# Patient Record
Sex: Male | Born: 1990 | Race: White | Hispanic: No | Marital: Single | Smoking: Never smoker
Health system: Southern US, Community
[De-identification: ages and names within clinical notes are randomized; demographics above are authoritative.]

## PROBLEM LIST (undated history)

## (undated) HISTORY — PX: CHOLECYSTECTOMY: SHX55

---
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
## 2012-11-03 ENCOUNTER — Encounter (HOSPITAL_COMMUNITY): Payer: Self-pay | Admitting: Emergency Medicine

## 2012-11-03 ENCOUNTER — Emergency Department (HOSPITAL_COMMUNITY)
Admission: EM | Admit: 2012-11-03 | Discharge: 2012-11-04 | Disposition: A | Discharge status: Home / Self Care | Payer: No Typology Code available for payment source | Attending: Emergency Medicine | Admitting: Emergency Medicine

## 2012-11-03 DIAGNOSIS — IMO0002 Reserved for concepts with insufficient information to code with codable children: Secondary | ICD-10-CM | POA: Insufficient documentation

## 2012-11-03 DIAGNOSIS — M542 Cervicalgia: Secondary | ICD-10-CM

## 2012-11-03 DIAGNOSIS — Y92410 Unspecified street and highway as the place of occurrence of the external cause: Secondary | ICD-10-CM | POA: Insufficient documentation

## 2012-11-03 DIAGNOSIS — S199XXA Unspecified injury of neck, initial encounter: Secondary | ICD-10-CM | POA: Insufficient documentation

## 2012-11-03 DIAGNOSIS — M62838 Other muscle spasm: Secondary | ICD-10-CM | POA: Insufficient documentation

## 2012-11-03 DIAGNOSIS — S0993XA Unspecified injury of face, initial encounter: Secondary | ICD-10-CM | POA: Insufficient documentation

## 2012-11-03 DIAGNOSIS — Y9389 Activity, other specified: Secondary | ICD-10-CM | POA: Insufficient documentation

## 2012-11-03 DIAGNOSIS — M546 Pain in thoracic spine: Secondary | ICD-10-CM

## 2012-11-03 MED ORDER — IBUPROFEN 800 MG PO TABS
800.0000 mg | ORAL_TABLET | Freq: Once | ORAL | Status: AC
  Administered 2012-11-04: 800 mg via ORAL
  Filled 2012-11-03: qty 1

## 2012-11-03 MED ORDER — DIAZEPAM 5 MG PO TABS
5.0000 mg | ORAL_TABLET | Freq: Once | ORAL | Status: AC
  Administered 2012-11-04: 5 mg via ORAL
  Filled 2012-11-03: qty 1

## 2012-11-03 MED ORDER — HYDROCODONE-ACETAMINOPHEN 5-325 MG PO TABS
2.0000 | ORAL_TABLET | Freq: Once | ORAL | Status: AC
  Administered 2012-11-04: 2 via ORAL
  Filled 2012-11-03: qty 2
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
## 2012-11-04 MED ORDER — DIAZEPAM 5 MG PO TABS: 5.0000 mg | ORAL_TABLET | Freq: Three times a day (TID) | ORAL | Status: DC | PRN

## 2012-11-04 MED ORDER — HYDROCODONE-ACETAMINOPHEN 5-325 MG PO TABS: 2.0000 | ORAL_TABLET | ORAL | Status: DC | PRN

## 2012-11-04 MED ORDER — IBUPROFEN 800 MG PO TABS: 800.0000 mg | ORAL_TABLET | Freq: Three times a day (TID) | ORAL | Status: DC
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
## 2014-06-27 ENCOUNTER — Emergency Department (HOSPITAL_COMMUNITY): Payer: Self-pay

## 2014-06-27 ENCOUNTER — Emergency Department (HOSPITAL_COMMUNITY)
Admission: EM | Admit: 2014-06-27 | Discharge: 2014-06-27 | Disposition: A | Discharge status: Home / Self Care | Payer: Self-pay | Attending: Emergency Medicine | Admitting: Emergency Medicine

## 2014-06-27 ENCOUNTER — Encounter (HOSPITAL_COMMUNITY): Payer: Self-pay | Admitting: Emergency Medicine

## 2014-06-27 DIAGNOSIS — Z79899 Other long term (current) drug therapy: Secondary | ICD-10-CM | POA: Insufficient documentation

## 2014-06-27 DIAGNOSIS — S99911A Unspecified injury of right ankle, initial encounter: Secondary | ICD-10-CM

## 2014-06-27 DIAGNOSIS — IMO0002 Reserved for concepts with insufficient information to code with codable children: Secondary | ICD-10-CM | POA: Insufficient documentation

## 2014-06-27 DIAGNOSIS — S99929A Unspecified injury of unspecified foot, initial encounter: Principal | ICD-10-CM

## 2014-06-27 DIAGNOSIS — Z791 Long term (current) use of non-steroidal anti-inflammatories (NSAID): Secondary | ICD-10-CM | POA: Insufficient documentation

## 2014-06-27 DIAGNOSIS — S99919A Unspecified injury of unspecified ankle, initial encounter: Principal | ICD-10-CM

## 2014-06-27 DIAGNOSIS — Y939 Activity, unspecified: Secondary | ICD-10-CM | POA: Insufficient documentation

## 2014-06-27 DIAGNOSIS — S8990XA Unspecified injury of unspecified lower leg, initial encounter: Secondary | ICD-10-CM | POA: Insufficient documentation

## 2014-06-27 DIAGNOSIS — Y9289 Other specified places as the place of occurrence of the external cause: Secondary | ICD-10-CM | POA: Insufficient documentation

## 2014-06-27 MED ORDER — TRAMADOL HCL 50 MG PO TABS: 50.0000 mg | ORAL_TABLET | Freq: Four times a day (QID) | ORAL | Status: AC | PRN

## 2014-06-27 MED ORDER — TRAMADOL HCL 50 MG PO TABS
50.0000 mg | ORAL_TABLET | Freq: Once | ORAL | Status: AC
  Administered 2014-06-27: 50 mg via ORAL
  Filled 2014-06-27: qty 1

## 2014-06-27 NOTE — ED Provider Notes (Signed)
CSN: [id number]     Arrival date & time 06/27/14  1338 History  This chart was scribed Emilia BeckKaitlyn Elian Gloster, PA-C, working with Merrie RoofJohn David Wofford III,  MD, by Leona CarryG. Clay Sherrill, ED Scribe. The patient was seen in TR11C/TR11C. The patient's care was started at 2:41 PM.     Chief Complaint  Patient presents with  . Ankle Pain   Patient is a 23 y.o. male presenting with ankle pain. The history is provided by the patient. No language interpreter was used.  Ankle Pain  HPI Comments: Corey SilkJoshua A Parveen is a 23 y.o. male who presents to the Emergency Department complaining of constant right ankle pain with associated swelling beginning yesterday when his grandfather fell and landed on his leg. Patient states that the pain radiates to his foot and shin. He reports that the pain is exacerbated by ROM. Patient denies numbness.   Patient does not have a PCP.   History reviewed. No pertinent past medical history. Past Surgical History  Procedure Laterality Date  . Cholecystectomy     No family history on file. History  Substance Use Topics  . Smoking status: Never Smoker   . Smokeless tobacco: Not on file  . Alcohol Use: No    Review of Systems  Musculoskeletal: Positive for arthralgias (right ankle).  Neurological: Negative for numbness.  All other systems reviewed and are negative.     Allergies  Review of patient's allergies indicates no known allergies.  Home Medications   Prior to Admission medications   Medication Sig Start Date End Date Taking? Authorizing Provider  diazepam (VALIUM) 5 MG tablet Take 1 tablet (5 mg total) by mouth every 8 (eight) hours as needed for anxiety (muscle spasm). 11/04/12   Olivia Mackielga M Otter, MD  HYDROcodone-acetaminophen (NORCO/VICODIN) 5-325 MG per tablet Take 2 tablets by mouth every 4 (four) hours as needed for pain. 11/04/12   Olivia Mackielga M Otter, MD  ibuprofen (ADVIL,MOTRIN) 800 MG tablet Take 1 tablet (800 mg total) by mouth 3 (three) times daily. 11/04/12    Olivia Mackielga M Otter, MD   Triage Vitals: BP 129/90  Pulse 79  Temp(Src) 97.6 F (36.4 C) (Oral)  Resp 18  Ht 6' (1.829 m)  Wt 235 lb (106.595 kg)  BMI 31.86 kg/m2  SpO2 96% Physical Exam  Nursing note and vitals reviewed. Constitutional: He is oriented to person, place, and time. He appears well-developed and well-nourished. No distress.  HENT:  Head: Normocephalic and atraumatic.  Eyes: Conjunctivae are normal.  Neck: Normal range of motion.  Cardiovascular: Normal rate and regular rhythm.  Exam reveals no gallop and no friction rub.   No murmur heard. Pulmonary/Chest: Effort normal and breath sounds normal. He has no wheezes. He has no rales. He exhibits no tenderness.  Musculoskeletal: Normal range of motion.  Neurological: He is alert and oriented to person, place, and time. Coordination normal.  Speech is goal-oriented. Moves limbs without ataxia.   Skin: Skin is warm and dry.  Abrasion noted to right distal tibia. Generalized right ankle tenderness to palpation. No obvious deformity. Patient is abe to wiggle toes.   Psychiatric: He has a normal mood and affect. His behavior is normal.    ED Course  Procedures (including critical care time) DIAGNOSTIC STUDIES: Oxygen Saturation is 96% on room air, normal by my interpretation.    COORDINATION OF CARE: 2:44 PM-Discussed treatment plan which includes a right ankle x-ray with pt at bedside and pt agreed to plan.    SPLINT APPLICATION  Date/Time: 3:03 PM Authorized by: Emilia Beck Consent: Verbal consent obtained. Risks and benefits: risks, benefits and alternatives were discussed Consent given by: patient Splint applied by: orthopedic technician Location details: right ankle Splint type: ASO brace Supplies used: ASO brace Post-procedure: The splinted body part was neurovascularly unchanged following the procedure. Patient tolerance: Patient tolerated the procedure well with no immediate complications.     Labs  Review Labs Reviewed - No data to display  Imaging Review Dg Ankle Complete Right  06/27/2014   CLINICAL DATA:  Ankle pain with numbness  EXAM: RIGHT ANKLE - COMPLETE 3+ VIEW  COMPARISON:  None.  FINDINGS: There is no evidence of fracture, dislocation, or joint effusion. There is no evidence of arthropathy or other focal bone abnormality. Soft tissues are unremarkable.  IMPRESSION: No acute osseous abnormality.   Electronically Signed   By: Genevive Bi M.D.   On: 06/27/2014 14:32     EKG Interpretation None      MDM   Final diagnoses:  Right ankle injury, initial encounter    3:03 PM Patient's xray unremarkable for acute changes. No neurovascular compromise. Patient will have Tramadol for pain. No other injury.   I personally performed the services described in this documentation, which was scribed in my presence. The recorded information has been reviewed and is accurate.    Emilia Beck, PA-C 06/27/14 1504

## 2014-06-27 NOTE — ED Notes (Signed)
Patient states he woke up out of sleep last night with pain and swelling in R ankle.  Patient denies injury.

## 2014-06-27 NOTE — ED Provider Notes (Signed)
Medical screening examination/treatment/procedure(s) were performed by non-physician practitioner and as supervising physician I was immediately available for consultation/collaboration.   Sammye Staff David Aprill Banko III, MD 06/27/14 2206 

## 2014-06-27 NOTE — Discharge Instructions (Signed)
Take Tramadol as needed for pain. Use ankle brace for support. Bear weight as tolerated. Rest, ice, and elevate your ankle for pain relief.

## 2015-07-23 IMAGING — CR DG ANKLE COMPLETE 3+V*R*
3 series · 3 of 3 positions shown · non-contrast
Comparison: None.

CLINICAL DATA: Ankle pain with numbness

EXAM:
RIGHT ANKLE - COMPLETE 3+ VIEW

[t ankle joint ap right]
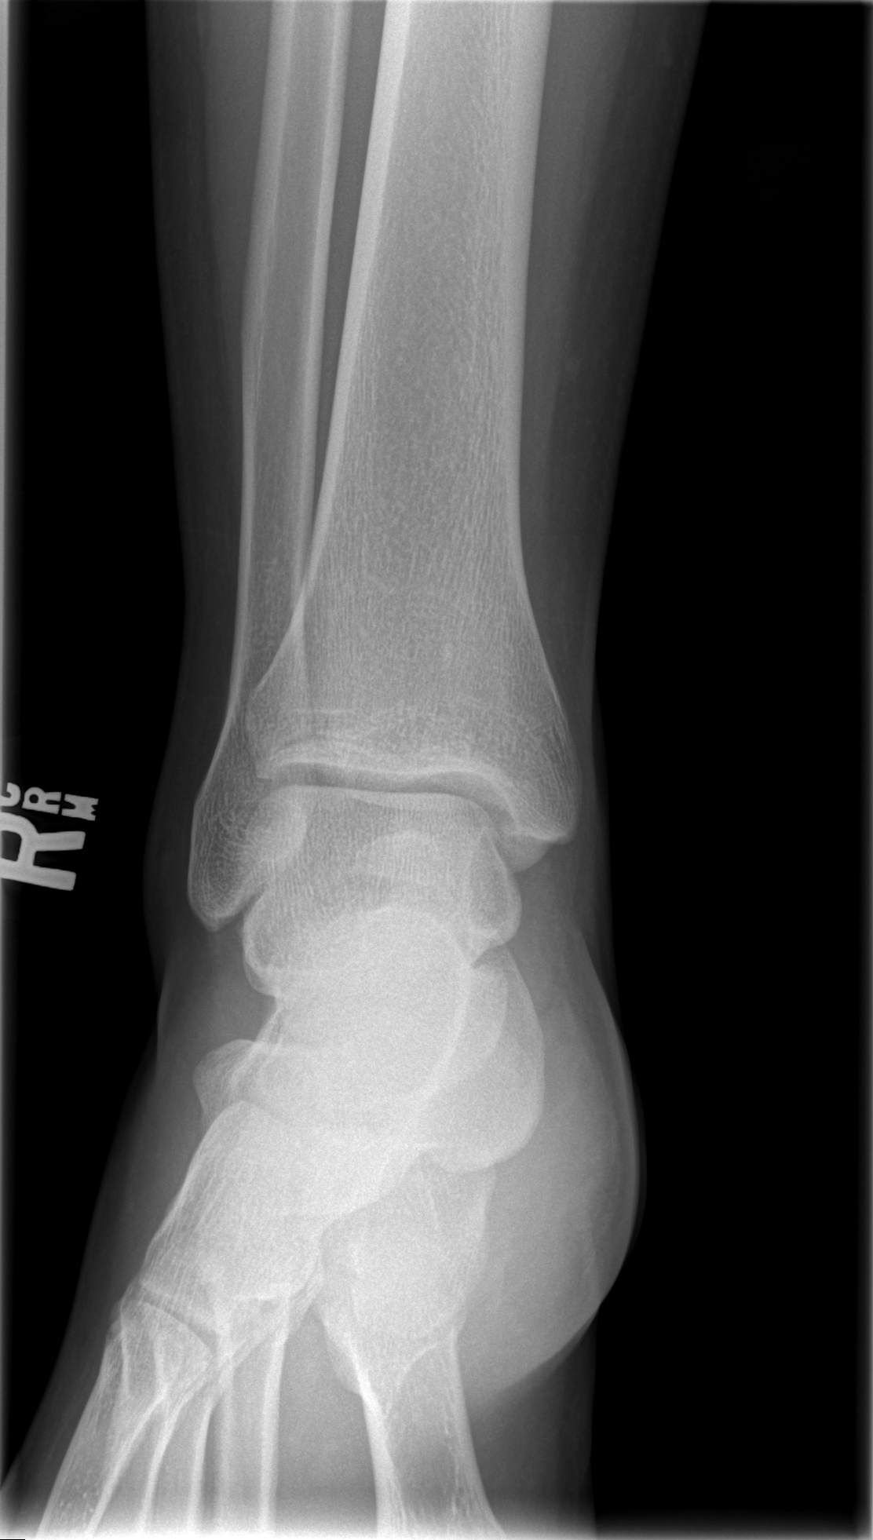

[t ankle joint oblique right]
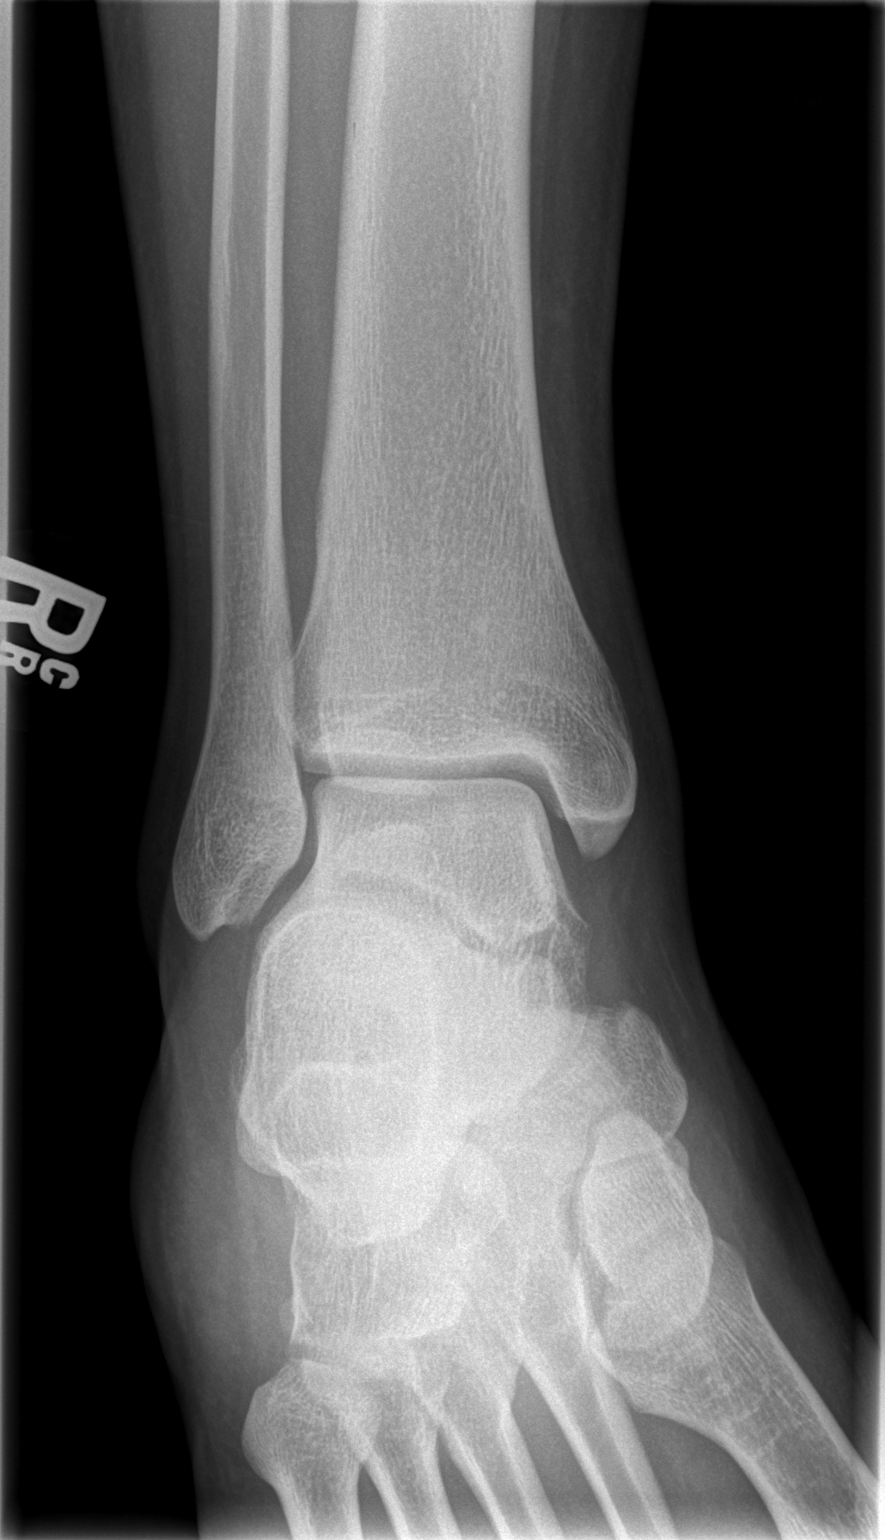

[t ankle joint lat right]
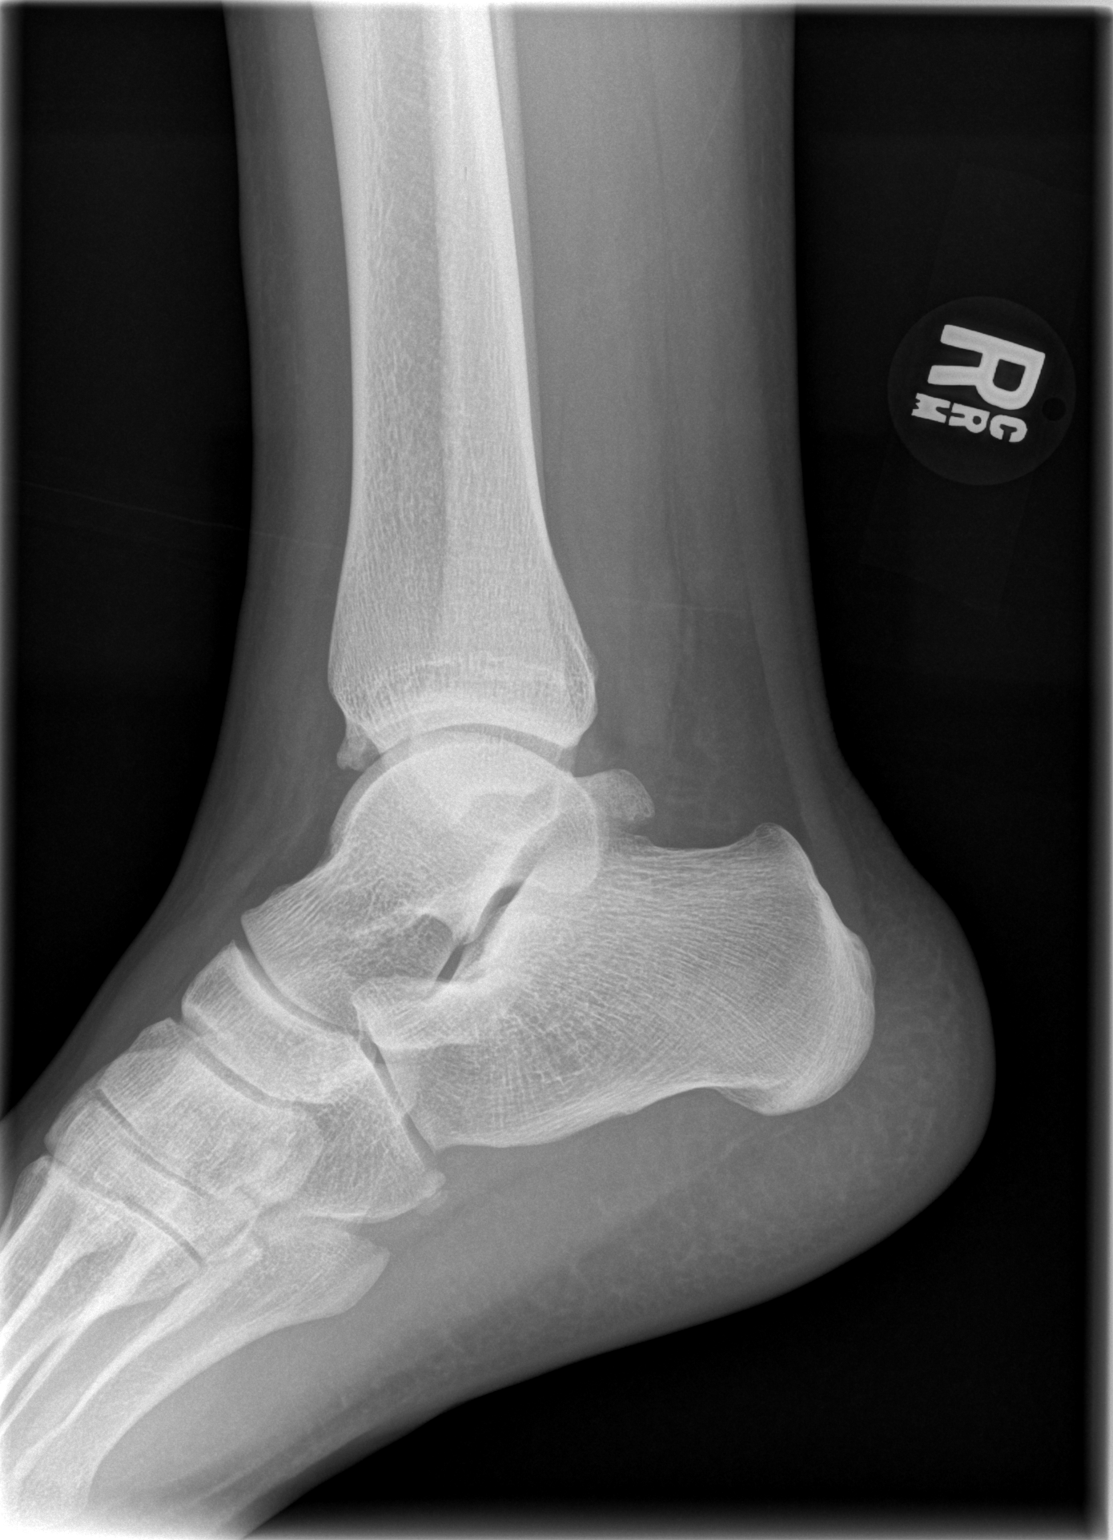

[3 of 3 positions shown; findings below may reference images not displayed]

FINDINGS: There is no evidence of fracture, dislocation, or joint effusion.
There is no evidence of arthropathy or other focal bone abnormality.
Soft tissues are unremarkable.
IMPRESSION: No acute osseous abnormality.

## 2015-09-19 ENCOUNTER — Encounter (HOSPITAL_COMMUNITY): Payer: Self-pay | Admitting: General Practice

## 2015-09-19 ENCOUNTER — Emergency Department (HOSPITAL_COMMUNITY)
Admission: EM | Admit: 2015-09-19 | Discharge: 2015-09-19 | Disposition: A | Discharge status: Home / Self Care | Payer: Worker's Compensation | Attending: Emergency Medicine | Admitting: Emergency Medicine

## 2015-09-19 DIAGNOSIS — Y9289 Other specified places as the place of occurrence of the external cause: Secondary | ICD-10-CM | POA: Insufficient documentation

## 2015-09-19 DIAGNOSIS — S0591XA Unspecified injury of right eye and orbit, initial encounter: Secondary | ICD-10-CM | POA: Insufficient documentation

## 2015-09-19 DIAGNOSIS — S0990XA Unspecified injury of head, initial encounter: Secondary | ICD-10-CM | POA: Diagnosis not present

## 2015-09-19 DIAGNOSIS — Y9389 Activity, other specified: Secondary | ICD-10-CM | POA: Insufficient documentation

## 2015-09-19 DIAGNOSIS — Y990 Civilian activity done for income or pay: Secondary | ICD-10-CM | POA: Diagnosis not present

## 2015-09-19 DIAGNOSIS — W2209XA Striking against other stationary object, initial encounter: Secondary | ICD-10-CM | POA: Insufficient documentation

## 2015-09-19 MED ORDER — ACETAMINOPHEN 500 MG PO TABS: 500.0000 mg | ORAL_TABLET | Freq: Four times a day (QID) | ORAL | Status: AC | PRN

## 2015-09-19 MED ORDER — ACETAMINOPHEN 325 MG PO TABS
650.0000 mg | ORAL_TABLET | Freq: Once | ORAL | Status: AC
  Administered 2015-09-19: 650 mg via ORAL
  Filled 2015-09-19: qty 2

## 2015-09-19 NOTE — Discharge Instructions (Signed)
1. Medications: tylenol, usual home medications 2. Treatment: rest, drink plenty of fluids 3. Follow Up: please followup with your primary doctor in 2-3 for discussion of your diagnoses and further evaluation after today's visit; if you do not have a primary care doctor use the resource guide provided to find one; please return to the ER for severe headache, vision changes, loss of consciousness, lethargy, new or worsening symptoms   Concussion, Adult A concussion, or closed-head injury, is a brain injury caused by a direct blow to the head or by a quick and sudden movement (jolt) of the head or neck. Concussions are usually not life-threatening. Even so, the effects of a concussion can be serious. If you have had a concussion before, you are more likely to experience concussion-like symptoms after a direct blow to the head.  CAUSES  Direct blow to the head, such as from running into another player during a soccer game, being hit in a fight, or hitting your head on a hard surface.  A jolt of the head or neck that causes the brain to move back and forth inside the skull, such as in a car crash. SIGNS AND SYMPTOMS The signs of a concussion can be hard to notice. Early on, they may be missed by you, family members, and health care providers. You may look fine but act or feel differently. Symptoms are usually temporary, but they may last for days, weeks, or even longer. Some symptoms may appear right away while others may not show up for hours or days. Every head injury is different. Symptoms include:  Mild to moderate headaches that will not go away.  A feeling of pressure inside your head.  Having more trouble than usual:  Learning or remembering things you have heard.  Answering questions.  Paying attention or concentrating.  Organizing daily tasks.  Making decisions and solving problems.  Slowness in thinking, acting or reacting, speaking, or reading.  Getting lost or being easily  confused.  Feeling tired all the time or lacking energy (fatigued).  Feeling drowsy.  Sleep disturbances.  Sleeping more than usual.  Sleeping less than usual.  Trouble falling asleep.  Trouble sleeping (insomnia).  Loss of balance or feeling lightheaded or dizzy.  Nausea or vomiting.  Numbness or tingling.  Increased sensitivity to:  Sounds.  Lights.  Distractions.  Vision problems or eyes that tire easily.  Diminished sense of taste or smell.  Ringing in the ears.  Mood changes such as feeling sad or anxious.  Becoming easily irritated or angry for little or no reason.  Lack of motivation.  Seeing or hearing things other people do not see or hear (hallucinations). DIAGNOSIS Your health care provider can usually diagnose a concussion based on a description of your injury and symptoms. He or she will ask whether you passed out (lost consciousness) and whether you are having trouble remembering events that happened right before and during your injury. Your evaluation might include:  A brain scan to look for signs of injury to the brain. Even if the test shows no injury, you may still have a concussion.  Blood tests to be sure other problems are not present. TREATMENT  Concussions are usually treated in an emergency department, in urgent care, or at a clinic. You may need to stay in the hospital overnight for further treatment.  Tell your health care provider if you are taking any medicines, including prescription medicines, over-the-counter medicines, and natural remedies. Some medicines, such as blood thinners (  anticoagulants) and aspirin, may increase the chance of complications. Also tell your health care provider whether you have had alcohol or are taking illegal drugs. This information may affect treatment.  Your health care provider will send you home with important instructions to follow.  How fast you will recover from a concussion depends on many  factors. These factors include how severe your concussion is, what part of your brain was injured, your age, and how healthy you were before the concussion.  Most people with mild injuries recover fully. Recovery can take time. In general, recovery is slower in older persons. Also, persons who have had a concussion in the past or have other medical problems may find that it takes longer to recover from their current injury. HOME CARE INSTRUCTIONS General Instructions  Carefully follow the directions your health care provider gave you.  Only take over-the-counter or prescription medicines for pain, discomfort, or fever as directed by your health care provider.  Take only those medicines that your health care provider has approved.  Do not drink alcohol until your health care provider says you are well enough to do so. Alcohol and certain other drugs may slow your recovery and can put you at risk of further injury.  If it is harder than usual to remember things, write them down.  If you are easily distracted, try to do one thing at a time. For example, do not try to watch TV while fixing dinner.  Talk with family members or close friends when making important decisions.  Keep all follow-up appointments. Repeated evaluation of your symptoms is recommended for your recovery.  Watch your symptoms and tell others to do the same. Complications sometimes occur after a concussion. Older adults with a brain injury may have a higher risk of serious complications, such as a blood clot on the brain.  Tell your teachers, school nurse, school counselor, coach, athletic trainer, or work Production designer, theatre/television/film about your injury, symptoms, and restrictions. Tell them about what you can or cannot do. They should watch for:  Increased problems with attention or concentration.  Increased difficulty remembering or learning new information.  Increased time needed to complete tasks or assignments.  Increased irritability  or decreased ability to cope with stress.  Increased symptoms.  Rest. Rest helps the brain to heal. Make sure you:  Get plenty of sleep at night. Avoid staying up late at night.  Keep the same bedtime hours on weekends and weekdays.  Rest during the day. Take daytime naps or rest breaks when you feel tired.  Limit activities that require a lot of thought or concentration. These include:  Doing homework or job-related work.  Watching TV.  Working on the computer.  Avoid any situation where there is potential for another head injury (football, hockey, soccer, basketball, martial arts, downhill snow sports and horseback riding). Your condition will get worse every time you experience a concussion. You should avoid these activities until you are evaluated by the appropriate follow-up health care providers. Returning To Your Regular Activities You will need to return to your normal activities slowly, not all at once. You must give your body and brain enough time for recovery.  Do not return to sports or other athletic activities until your health care provider tells you it is safe to do so.  Ask your health care provider when you can drive, ride a bicycle, or operate heavy machinery. Your ability to react may be slower after a brain injury. Never do these activities  if you are dizzy.  Ask your health care provider about when you can return to work or school. Preventing Another Concussion It is very important to avoid another brain injury, especially before you have recovered. In rare cases, another injury can lead to permanent brain damage, brain swelling, or death. The risk of this is greatest during the first 7-10 days after a head injury. Avoid injuries by:  Wearing a seat belt when riding in a car.  Drinking alcohol only in moderation.  Wearing a helmet when biking, skiing, skateboarding, skating, or doing similar activities.  Avoiding activities that could lead to a second  concussion, such as contact or recreational sports, until your health care provider says it is okay.  Taking safety measures in your home.  Remove clutter and tripping hazards from floors and stairways.  Use grab bars in bathrooms and handrails by stairs.  Place non-slip mats on floors and in bathtubs.  Improve lighting in dim areas. SEEK MEDICAL CARE IF:  You have increased problems paying attention or concentrating.  You have increased difficulty remembering or learning new information.  You need more time to complete tasks or assignments than before.  You have increased irritability or decreased ability to cope with stress.  You have more symptoms than before. Seek medical care if you have any of the following symptoms for more than 2 weeks after your injury:  Lasting (chronic) headaches.  Dizziness or balance problems.  Nausea.  Vision problems.  Increased sensitivity to noise or light.  Depression or mood swings.  Anxiety or irritability.  Memory problems.  Difficulty concentrating or paying attention.  Sleep problems.  Feeling tired all the time. SEEK IMMEDIATE MEDICAL CARE IF:  You have severe or worsening headaches. These may be a sign of a blood clot in the brain.  You have weakness (even if only in one hand, leg, or part of the face).  You have numbness.  You have decreased coordination.  You vomit repeatedly.  You have increased sleepiness.  One pupil is larger than the other.  You have convulsions.  You have slurred speech.  You have increased confusion. This may be a sign of a blood clot in the brain.  You have increased restlessness, agitation, or irritability.  You are unable to recognize people or places.  You have neck pain.  It is difficult to wake you up.  You have unusual behavior changes.  You lose consciousness. MAKE SURE YOU:  Understand these instructions.  Will watch your condition.  Will get help right away  if you are not doing well or get worse.   This information is not intended to replace advice given to you by your health care provider. Make sure you discuss any questions you have with your health care provider.   Document Released: 01/30/2004 Document Revised: 11/30/2014 Document Reviewed: 06/01/2013 Elsevier Interactive Patient Education 2016 [employer].   Emergency Department Resource Guide 1) Find a Doctor and Pay Out of Pocket Although you won't have to find out who is covered by your insurance plan, it is a good idea to ask around and get recommendations. You will then need to call the office and see if the doctor you have chosen will accept you as a new patient and what types of options they offer for patients who are self-pay. Some doctors offer discounts or will set up payment plans for their patients who do not have insurance, but you will need to ask so you aren't surprised when  you get to your appointment.  2) Contact Your Local Health Department Not all health departments have doctors that can see patients for sick visits, but many do, so it is worth a call to see if yours does. If you don't know where your local health department is, you can check in your phone book. The CDC also has a tool to help you locate your state's health department, and many state websites also have listings of all of their local health departments.  3) Find a Walk-in Clinic If your illness is not likely to be very severe or complicated, you may want to try a walk in clinic. These are popping up all over the country in pharmacies, drugstores, and shopping centers. They're usually staffed by nurse practitioners or physician assistants that have been trained to treat common illnesses and complaints. They're usually fairly quick and inexpensive. However, if you have serious medical issues or chronic medical problems, these are probably not your best option.  No Primary Care Doctor: - Call Health Connect at   [phone] - they can help you locate a primary care doctor that  accepts your insurance, provides certain services, etc. - Physician Referral Service- [phone]  Chronic Pain Problems: Organization         Address  Phone   Notes  [facility]  [phone] Patients need to be referred by their primary care doctor.   Medication Assistance: Organization         Address  Phone   Notes  [facility] Medication [facility] [address] [place], [place] [address] [phone] --Must be a resident of [facility] -- Must have NO insurance coverage whatsoever (no Medicaid/ Medicare, etc.) -- The pt. MUST have a primary care doctor that directs their care regularly and follows them in the community   [facility]  [phone]   [employer]  [phone]    Agencies that provide inexpensive medical care: Organization         Address  Phone   Notes  [facility]  [phone]   [facility]    [phone]   [facility] [address] [place], [place] [address] [phone]   [facility] [address] [place], [place] [phone]   Planned Parenthood    [phone]   [facility]    [phone]   [facility]  [address], [place] Phone:  [phone], Fax:  [phone] Hours of Operation:  9 am - 6 pm, M-F.  Also accepts Medicaid/Medicare and self-pay.  [facility]  [address], [place] Phone: [phone], Fax: [phone]. Hours of Operation:  8:30 am - 5:30 pm, M-F.  Also accepts Medicaid and self-pay.  [facility] [place] [address] [place] Phone: [phone]   [facility] [address] Natasha Bence [place], [place] [phone], Ext. [phone] Mondays & Thursdays: 7-9 AM.  First 15 patients are seen on a first come, first serve basis.     Medicaid-accepting [facility] Providers:  Organization         Address  Phone   Notes  [facility] [address], [place] [phone] Also accepts self-pay patients.  [facility] [address] Laurell Josephs [place], [place]  [phone]   [facility] [address], [place] [phone]   [facility] [address] [place] [address], [place] [phone]   Renaye RakersVeita Bland [address], [place]   [phone] Only accepts [place] Access [place] patients after they have their name applied to their card.   Self-Pay (no insurance) in [facility] [place]:  Organization         Address  Phone   Notes  Sickle Cell Patients, [facility] [address], [place] [phone]   [facility] [address], [place] [phone]   [facility] [place]  [address], [place] [phone]   [facility]/Dr. Osei-Bonsu  [address] [place] [address], [place] or [phone], [place] [phone] Phone number for both [place] and [place] locations is the same.  [facility] [address], [place] [phone]   [facility] [place] [address] [place] [address], [place] or [address] [phone]   [facility] [address], [place] [phone]) [phone], phone; [phone], fax Sees patients 1st and 3rd Saturday of every month.  Must not qualify for public or private insurance (i.e. Medicaid, Medicare, [place] Health Choice, Veterans' Benefits)  Household income should be no more than 200% of the poverty level The clinic cannot treat you if you are pregnant or think you are pregnant  Sexually transmitted diseases are not treated at the clinic.    Dental Care: Organization         Address  Phone  Notes  [facility]  Department of [facility] [address], [place] [phone] Accepts children up to age 23 who are enrolled in [place] or [place] Health Choice; pregnant women with a Medicaid card; and children who have applied for Medicaid or [place] Health Choice, but were declined, whose parents can pay a reduced fee at time of service.  [facility] [place]  [address], [place] [phone] Accepts children up to age 23 who are enrolled in [place] or [place] Health Choice; pregnant women with a Medicaid card; and children who have applied for Medicaid or [place] Health Choice, but were declined, whose parents can pay a reduced fee at time of service.  [facility]  [address], [place] [phone] Patients are seen by appointment only. Walk-ins are not accepted. [facility] will see patients 23 years of age and older. Monday - Tuesday (8am-5pm) Most Wednesdays (8:30-5pm) $30 per visit, cash only  [facility]  [address], [place] [phone] Patients are seen by appointment only. Walk-ins are not accepted. [facility] will see patients 23 years of age and older. One Wednesday Evening (Monthly: Volunteer Based).  $30 per visit, cash only  [facility]  [phone] for adults; Children under age 214, call Graduate Pediatric Dentistry at [phone]. Children aged 634-14, please call [phone] to request a pediatric application.  Dental services are provided in all areas of dental care including fillings, crowns and bridges, complete and partial dentures, implants, gum treatment, root canals, and extractions. Preventive care is also provided. Treatment is provided to both adults and children. Patients are selected via a lottery and there is often a waiting list.   [facility] [address] Ginette Otto  [phone]  [email or web address]   [facility] [address] [place], [place] [phone], Ext. [phone] Second and Fourth Thursday of each month, opens at 6:30 AM; Clinic ends at 9 AM.  Patients are seen on a first-come first-served basis, and a limited number are seen during each clinic.   [facility]  [address] Ether Griffins [place], [place] [phone]   Eligibility Requirements You must have lived in [place], [place], or [place] counties for at least the last three months.   You cannot be eligible for state or federal sponsored [place], including [employer], [place], or [employer].   You generally cannot be eligible for healthcare insurance through your employer.    How to apply: Eligibility screenings are held every Tuesday and Wednesday afternoon from 1:00 pm until 4:00 pm. You do not need an appointment for the interview!  [facility] [address], [place], [place] [phone]   [facility] Health Department  [phone]   [facility] Health Department  [phone]   [facility] Health Department  [phone]    Behavioral Health Resources in the Community: Intensive Outpatient Programs Organization         Address  Phone  Notes  [facility] Services [address], [place], [place] [phone]   [facility] Outpatient [address], [place], [place] [phone]   [facility] [address], [place], [place]  [phone]   [facility] Mental Health [address],  [place], [place] [phone] or [phone]   Substance Abuse Resources Organization         Address  Phone  Notes  [facility]  [phone]   [facility]  [phone]   [facility] [place]  [phone]   Floydene Flock  [phone]   Residential & Outpatient Substance Abuse Program  [phone]   Psychological Services Organization          Address  Phone  Notes  [facility] Behavioral Health  [phone]   [facility] Services  [phone]   [facility] Mental Health [address], [place] [phone] or [phone]    Mobile Crisis Teams Organization         Address  Phone  Notes  [facility], Mobile Crisis Care Unit  [phone]   [facility]  [address] [place], [place] [phone]   Doristine Locks [address] [place] [phone]    Self-Help/Support Groups Organization         Address  Phone             Notes  [facility] [place] - variety of support groups  [phone]- I7437963 Call for more information  Narcotics Anonymous (NA), [facility] [address], [employer] [place]  2 meetings at this location   Statistician         Address  Phone  Notes  [facility] [address],    [place]  [phone]   [facility]  [address] [place] [address], [place], [place] [phone]   [facility] [address] [place], [place] [phone] Admissions: 8am-3pm M-F  [facility] [address],    [place], [place] [phone]   [facility] [address] [place], [place], [place] [phone]   [facility] [address],  [place], [place] [phone]   [facility] - Intensive Outpatient [address], [place], [place] [phone]   [facility] ([facility] [address]  [place], [place] [phone] or [phone]   [facility] ([facility]) [address] [place], [place] [phone] Accepts Medicaid  [facility] [address]  [place] [phone] Substance Abuse/Addiction Treatment   [facility] Behavioral Health Resources Organization         Address  Phone  Notes  [facility]  [phone]) [phone]   Angie FavaJulie Brannon, PhD [address], [address] [place], [place]   [phone]) [phone] or [phone]   [facility]   [address] [place], [place] [phone]) [phone]   [facility] [address], [place], [place] [phone] Insurance/Medicaid/sponsorship through [facility]  Faith and Families [address]                                    [place], [place] [phone] Therapy/tele-psych/case  [facility] [address]   [place], [place] [phone]    Dr. Lolly MustacheArfeen  [phone]) [phone]   [facility] [place]  [employer] [facility] 1) [address], [place] 2) [address], [place] 3)  [address], [place] [phone]  [phone]) [phone]   [facility] Hotline [phone]) [phone] or [phone]) [phone] (After Hours)

## 2015-09-19 NOTE — ED Notes (Signed)
Pt presents via [facility]. 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 Pt denies any LOC, N/V, or HA pt is reporting blurring vision in his right eye. Pt went to urgent care, and was sent to the ED for passing Romberg test. Pt is A/O.

## 2015-09-19 NOTE — ED Provider Notes (Signed)
CSN: [id number]     Arrival date & time 09/19/15  1512 History   First MD Initiated Contact with Patient 09/19/15 1513     Chief Complaint  Patient presents with  . Head Injury    HPI   Corey Moon is a 24 y.o. male with no pertinent PMH who presents to the ED with head injury. 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 He reports pain to his right forehead and right eye, as well dizziness and blurry vision. He states his symptoms are constant. He reports light exacerbates his pain. He has not tried anything for symptom relief. He denies headache, LOC, or additional injury. He denies chest pain, shortness of breath, abdominal pain, nausea, vomiting, numbness, weakness, paresthesia. Per report, patient was sent from urgent care for failed Romberg.    History reviewed. No pertinent past medical history. Past Surgical History  Procedure Laterality Date  . Cholecystectomy     No family history on file. Social History  Substance Use Topics  . Smoking status: Never Smoker   . Smokeless tobacco: None  . Alcohol Use: No     Review of Systems  Eyes: Positive for photophobia and visual disturbance.  Respiratory: Negative for shortness of breath.   Cardiovascular: Negative for chest pain.  Gastrointestinal: Negative for nausea, vomiting, abdominal pain, diarrhea and constipation.  Musculoskeletal: Negative for back pain, neck pain and neck stiffness.  Neurological: Positive for dizziness. Negative for syncope, weakness, light-headedness, numbness and headaches.  All other systems reviewed and are negative.     Allergies  Review of patient's allergies indicates no known allergies.  Home Medications   Prior to Admission medications   Medication Sig Start Date End Date Taking? Authorizing Provider  traMADol (ULTRAM) 50 MG tablet Take 1 tablet (50 mg  total) by mouth every 6 (six) hours as needed. 06/27/14  Yes Kaitlyn Szekalski, PA-C  acetaminophen (TYLENOL) 500 MG tablet Take 1 tablet (500 mg total) by mouth every 6 (six) hours as needed. 09/19/15   Mady Gemma, PA-C    BP 126/81 mmHg  Pulse 56  Temp(Src) 98.1 F (36.7 C) (Oral)  Resp 18  Ht 6' (1.829 m)  Wt 274 lb (124.286 kg)  BMI 37.15 kg/m2  SpO2 97% Physical Exam  Constitutional: He is oriented to person, place, and time. He appears well-developed and well-nourished. No distress.  HENT:  Head: Normocephalic and atraumatic. Head is without raccoon's eyes, without Battle's sign, without abrasion, without contusion and without laceration.  Right Ear: External ear normal.  Left Ear: External ear normal.  Nose: Nose normal.  Mouth/Throat: Uvula is midline, oropharynx is clear and moist and mucous membranes are normal.  Mild TTP of right forehead and right periorbital region.   Eyes: Conjunctivae, EOM and lids are normal. Pupils are equal, round, and reactive to light. Right eye exhibits no discharge. Left eye exhibits no discharge. Right conjunctiva is not injected. Right conjunctiva has no hemorrhage. Left conjunctiva is not injected. Left conjunctiva has no hemorrhage. No scleral icterus.  Neck: Normal range of motion. Neck supple. No spinous process tenderness and no muscular tenderness present.  Cardiovascular: Normal rate, regular rhythm, normal heart sounds, intact distal pulses and normal pulses.   Pulmonary/Chest: Effort normal and breath sounds normal. No respiratory distress.  Abdominal: Soft. Normal appearance and bowel sounds  are normal. He exhibits no distension and no mass. There is no tenderness. There is no rigidity, no rebound and no guarding.  Musculoskeletal: Normal range of motion. He exhibits no edema or tenderness.  Neurological: He is alert and oriented to person, place, and time. He has normal strength and normal reflexes. No cranial nerve deficit or  sensory deficit. He displays a negative Romberg sign. Coordination and gait normal. GCS eye subscore is 4. GCS verbal subscore is 5. GCS motor subscore is 6.  Skin: Skin is warm, dry and intact. No rash noted. He is not diaphoretic. No erythema. No pallor.  Psychiatric: He has a normal mood and affect. His speech is normal and behavior is normal. Judgment and thought content normal.  Nursing note and vitals reviewed.    Visual Acuity  Right Eye Distance:   Left Eye Distance:   Bilateral Distance:    Right Eye Near: R Near: 20/30 Left Eye Near:  L Near: 20/25 Bilateral Near:  20/25      ED Course  Procedures (including critical care time)  Labs Review Labs Reviewed - No data to display  Imaging Review No results found.     EKG Interpretation None      MDM   Final diagnoses:  Head injury, initial encounter    24 year old male presents with pain to his right forehead and right eye after being hit by a metal beam at work. Reports associated dizziness and blurry vision. Denies headache, LOC, or additional injury. Denies chest pain, shortness of breath, abdominal pain, nausea, vomiting, numbness, weakness, paresthesia. Patient is afebrile. Vital signs stable. Alert and oriented x 4. GCS 15. Head normocephalic and atraumatic. Mild TTP of right forehead and right periorbital region. No palpable deformity. No hemorrhage or evidence of trauma to right eye. PERRL. EOMs intact. Normal neuro exam with no focal deficit. Strength, sensation, coordination, DTRs intact. Negative Romberg. Patient ambulates without difficulty.   Pain treated with tylenol. Visual acuity 20/30 right eye, 20/25 left eye. [place] CT rule negative. Do not feel imaging is indicated at this time. Symptoms most likely due to concussion. Patient to follow-up with PCP. Return precautions discussed at length. Patient verbalizes his understanding and is in agreement with plan.  BP 126/81 mmHg  Pulse 56   Temp(Src) 98.1 F (36.7 C) (Oral)  Resp 18  Ht 6' (1.829 m)  Wt 274 lb (124.286 kg)  BMI 37.15 kg/m2  SpO2 97%   Mady Gemmalizabeth C [place], PA-C 09/19/15 1648  Arby BarretteMarcy Pfeiffer, MD 09/23/15 1130
